# Patient Record
Sex: Female | Born: 1995 | Race: White | Hispanic: No | Marital: Single | Smoking: Never smoker
Health system: Southern US, Community
[De-identification: ages and names within clinical notes are randomized; demographics above are authoritative.]

---

## 2020-05-20 ENCOUNTER — Emergency Department
Admission: EM | Admit: 2020-05-20 | Discharge: 2020-05-20 | Disposition: A | Discharge status: Home / Self Care | Payer: No Typology Code available for payment source | Attending: Emergency Medicine | Admitting: Emergency Medicine

## 2020-05-20 ENCOUNTER — Encounter: Payer: Self-pay | Admitting: Emergency Medicine

## 2020-05-20 ENCOUNTER — Other Ambulatory Visit: Payer: Self-pay

## 2020-05-20 DIAGNOSIS — S3023XA Contusion of vagina and vulva, initial encounter: Secondary | ICD-10-CM | POA: Insufficient documentation

## 2020-05-20 DIAGNOSIS — Y9389 Activity, other specified: Secondary | ICD-10-CM | POA: Diagnosis not present

## 2020-05-20 DIAGNOSIS — Y998 Other external cause status: Secondary | ICD-10-CM | POA: Diagnosis not present

## 2020-05-20 DIAGNOSIS — S3095XA Unspecified superficial injury of vagina and vulva, initial encounter: Secondary | ICD-10-CM | POA: Diagnosis present

## 2020-05-20 DIAGNOSIS — W19XXXA Unspecified fall, initial encounter: Secondary | ICD-10-CM

## 2020-05-20 DIAGNOSIS — W1830XA Fall on same level, unspecified, initial encounter: Secondary | ICD-10-CM | POA: Diagnosis not present

## 2020-05-20 DIAGNOSIS — N9089 Other specified noninflammatory disorders of vulva and perineum: Secondary | ICD-10-CM

## 2020-05-20 DIAGNOSIS — Y92009 Unspecified place in unspecified non-institutional (private) residence as the place of occurrence of the external cause: Secondary | ICD-10-CM | POA: Insufficient documentation

## 2020-05-20 MED ORDER — OXYCODONE-ACETAMINOPHEN 5-325 MG PO TABS: 1.0000 | ORAL_TABLET | Freq: Four times a day (QID) | ORAL | 0 refills | Status: AC | PRN

## 2020-05-20 MED ORDER — OXYCODONE-ACETAMINOPHEN 5-325 MG PO TABS
1.0000 | ORAL_TABLET | Freq: Once | ORAL | Status: AC
  Administered 2020-05-20: 1 via ORAL
  Filled 2020-05-20: qty 1

## 2020-05-20 NOTE — ED Notes (Signed)
See triage note  States she fell back onto her child's wagon   States she sat on the seat  Developed pain and swelling to right labia area

## 2020-05-20 NOTE — ED Provider Notes (Signed)
[facility] Emergency Department Provider Note  ____________________________________________   First MD Initiated Contact with Patient 05/20/20 1255     (approximate)  I have reviewed the triage vital signs and the nursing notes.   HISTORY  Chief Complaint Fall   HPI Suzanne White is a 24 y.o. female presents to the ED with complaint of right labial swelling.  Patient states that she lost her balance this morning and landed on her child's wagon.  Patient states that she hit in between her legs and is now having pain, bruising and swelling to her right labia.  Patient denies any blood seen since this event.  Currently she rates her pain as a 3 out of 10.      History reviewed. No pertinent past medical history.  There are no problems to display for this patient.   Past Surgical History:  Procedure Laterality Date  . CESAREAN SECTION     x 2    Prior to Admission medications   Medication Sig Start Date End Date Taking? Authorizing Provider  oxyCODONE-acetaminophen (PERCOCET) 5-325 MG tablet Take 1 tablet by mouth every 6 (six) hours as needed for severe pain. 05/20/20 05/20/21  Johnn Hai, PA-C    Allergies Patient has no known allergies.  No family history on file.  Social History Social History   Tobacco Use  . Smoking status: Never Smoker  . Smokeless tobacco: Never Used  Substance Use Topics  . Alcohol use: Not Currently  . Drug use: Not Currently    Review of Systems Constitutional: No fever/chills Cardiovascular: Denies chest pain. Respiratory: Denies shortness of breath. Gastrointestinal: No abdominal pain.  No nausea, no vomiting.  Genitourinary: Painful right labia. Musculoskeletal: Negative for back pain. Skin: Negative for rash. Neurological: Negative for headaches, focal weakness or numbness. ____________________________________________   PHYSICAL EXAM:  VITAL SIGNS: ED Triage Vitals  Enc Vitals Group      BP 05/20/20 1116 112/67     Pulse Rate 05/20/20 1116 93     Resp 05/20/20 1116 16     Temp 05/20/20 1116 98.2 F (36.8 C)     Temp Source 05/20/20 1116 Oral     SpO2 05/20/20 1116 100 %     Weight 05/20/20 1117 125 lb (56.7 kg)     Height 05/20/20 1117 5\' 5"  (1.651 m)     Head Circumference --      Peak Flow --      Pain Score 05/20/20 1116 3     Pain Loc --      Pain Edu? --      Excl. in [place]? --     Constitutional: Alert and oriented. Well appearing and in no acute distress. Eyes: Conjunctivae are normal.  Head: Atraumatic. Neck: No stridor.   Cardiovascular: Normal rate, regular rhythm. Grossly normal heart sounds.  Good peripheral circulation. Respiratory: Normal respiratory effort.  No retractions. Lungs CTAB. Gastrointestinal: Soft and nontender. No distention. Genitourinary: On examination of the right labia there is moderate amount of soft tissue edema with ecchymosis present.  Skin is intact and no bleeding is noted in this area or from the vagina.  Area is markedly tender to light touch.  Edema to the distal area is comparable with the size of a small key lime.   Musculoskeletal: Patient is ambulatory without any assistance. Neurologic:  Normal speech and language. No gross focal neurologic deficits are appreciated. No gait instability. Skin:  Skin is warm, dry and intact.  Skin  as noted above. Psychiatric: Mood and affect are normal. Speech and behavior are normal.  ____________________________________________   LABS (all labs ordered are listed, but only abnormal results are displayed)  Labs Reviewed - No data to display  PROCEDURES  Procedure(s) performed (including Critical Care):  Procedures   ____________________________________________   INITIAL IMPRESSION / ASSESSMENT AND PLAN / ED COURSE  As part of my medical decision making, I reviewed the following data within the electronic MEDICAL RECORD NUMBER Notes from prior ED visits and [place] Controlled Substance  Database  24 year old female presents to the ED with a straddle type injury in which she lost her balance and fell on her child's wagon.  She complains of swelling to the right labia but reports no bleeding.  On exam skin is intact and area is edematous with ecchymosis noted to the distal portion of the labia.  No vaginal bleeding is noted.  Patient was given an ice pack while in the ED along with Percocet for pain.  I discussed this with Dr. Erma Heritage who recommended continued ice and pain medication.  At this time there is no imaging that is needed.  Patient was encouraged to return to the emergency department if any worsening of her symptoms or urgent concerns.  She is continue with ice packs to reduce swelling.  Also a prescription for continued pain medication was sent to her pharmacy.  ____________________________________________   FINAL CLINICAL IMPRESSION(S) / ED DIAGNOSES  Final diagnoses:  Hematoma of labia majora  Fall in home, initial encounter     ED Discharge Orders         Ordered    oxyCODONE-acetaminophen (PERCOCET) 5-325 MG tablet  Every 6 hours PRN     Discontinue  Reprint     05/20/20 1438           Note:  This document was prepared using Dragon voice recognition software and may include unintentional dictation errors.    Tommi Rumps, PA-C 05/20/20 1702    Chesley Noon, MD 05/27/20 2035

## 2020-05-20 NOTE — ED Triage Notes (Signed)
Pt to ED via POV, pt states that she stood up his morning and that she fell onto her children's wagon. Pt states that when she fell, she hit her labia on the seat of the wagon, pt reports that her labia is now bruised and swollen. Pt wants to have it looked at to. Pt is in NAD.   Pt reports that she did have a brief syncopal episode. Pt declines to be seen and worked up for this at this time. Pt states that she has had similar episodes in the past when standing too quickly.

## 2020-05-20 NOTE — Discharge Instructions (Signed)
Return to the emergency department over the weekend if any worsening of your symptoms, increase in size, bleeding or urgent concerns.  A prescription for pain medication was sent to your pharmacy.  Do not take this medication if you plan on driving as it could cause drowsiness and increase your risk for injury.  Also avoid anti-inflammatories at this time as they can act as a blood thinner and could cause increase bleeding.  Ice to the area to reduce swelling.

## 2020-05-22 ENCOUNTER — Emergency Department (HOSPITAL_COMMUNITY): Payer: No Typology Code available for payment source

## 2020-05-22 ENCOUNTER — Emergency Department (HOSPITAL_COMMUNITY)
Admission: EM | Admit: 2020-05-22 | Discharge: 2020-05-22 | Disposition: A | Discharge status: Home / Self Care | Payer: No Typology Code available for payment source | Attending: Emergency Medicine | Admitting: Emergency Medicine

## 2020-05-22 ENCOUNTER — Encounter (HOSPITAL_COMMUNITY): Payer: Self-pay

## 2020-05-22 ENCOUNTER — Other Ambulatory Visit: Payer: Self-pay

## 2020-05-22 DIAGNOSIS — W01198D Fall on same level from slipping, tripping and stumbling with subsequent striking against other object, subsequent encounter: Secondary | ICD-10-CM | POA: Diagnosis not present

## 2020-05-22 DIAGNOSIS — N9089 Other specified noninflammatory disorders of vulva and perineum: Secondary | ICD-10-CM

## 2020-05-22 DIAGNOSIS — R102 Pelvic and perineal pain: Secondary | ICD-10-CM | POA: Diagnosis not present

## 2020-05-22 DIAGNOSIS — S3023XD Contusion of vagina and vulva, subsequent encounter: Secondary | ICD-10-CM | POA: Insufficient documentation

## 2020-05-22 LAB — CBC WITH DIFFERENTIAL/PLATELET
Abs Immature Granulocytes: 0.02 10*3/uL (ref 0.00–0.07)
Basophils Absolute: 0 10*3/uL (ref 0.0–0.1)
Basophils Relative: 0 %
Eosinophils Absolute: 0 10*3/uL (ref 0.0–0.5)
Eosinophils Relative: 0 %
HCT: 37.4 % (ref 36.0–46.0)
Hemoglobin: 12.2 g/dL (ref 12.0–15.0)
Immature Granulocytes: 0 %
Lymphocytes Relative: 9 %
Lymphs Abs: 0.9 10*3/uL (ref 0.7–4.0)
MCH: 28.8 pg (ref 26.0–34.0)
MCHC: 32.6 g/dL (ref 30.0–36.0)
MCV: 88.2 fL (ref 80.0–100.0)
Monocytes Absolute: 0.6 10*3/uL (ref 0.1–1.0)
Monocytes Relative: 6 %
Neutro Abs: 8.6 10*3/uL — ABNORMAL HIGH (ref 1.7–7.7)
Neutrophils Relative %: 85 %
Platelets: 323 10*3/uL (ref 150–400)
RBC: 4.24 MIL/uL (ref 3.87–5.11)
RDW: 13.1 % (ref 11.5–15.5)
WBC: 10.1 10*3/uL (ref 4.0–10.5)
nRBC: 0 % (ref 0.0–0.2)

## 2020-05-22 LAB — BASIC METABOLIC PANEL
Anion gap: 11 (ref 5–15)
BUN: 12 mg/dL (ref 6–20)
CO2: 20 mmol/L — ABNORMAL LOW (ref 22–32)
Calcium: 9.1 mg/dL (ref 8.9–10.3)
Chloride: 108 mmol/L (ref 98–111)
Creatinine, Ser: 0.78 mg/dL (ref 0.44–1.00)
GFR calc Af Amer: >60 mL/min (ref >60)
GFR calc non Af Amer: >60 mL/min (ref >60)
Glucose, Bld: 70 mg/dL (ref 70–99)
Potassium: 3.6 mmol/L (ref 3.5–5.1)
Sodium: 139 mmol/L (ref 135–145)

## 2020-05-22 LAB — I-STAT BETA HCG BLOOD, ED (MC, WL, AP ONLY): I-stat hCG, quantitative: <5 m[IU]/mL (ref <5)

## 2020-05-22 MED ORDER — IOHEXOL 300 MG/ML  SOLN
100.0000 mL | Freq: Once | INTRAMUSCULAR | Status: AC | PRN
  Administered 2020-05-22: 100 mL via INTRAVENOUS

## 2020-05-22 MED ORDER — OXYCODONE-ACETAMINOPHEN 5-325 MG PO TABS
1.0000 | ORAL_TABLET | Freq: Once | ORAL | Status: AC
  Administered 2020-05-22: 1 via ORAL
  Filled 2020-05-22: qty 1

## 2020-05-22 NOTE — ED Triage Notes (Signed)
Pt presents with vaginal "hematoma" x3 days, pt seen at [facility] in [place] for the same on Friday, pt prescribed oxycodone but unable to get the RX filled. Pt reports increase pain today after showering. Pt states she fell on her childs toy

## 2020-05-22 NOTE — Discharge Instructions (Addendum)
You have been treated for a hematoma.  You were prescribed pain meds at [facility] please take as prescribed.  Do not take that medication with Tylenol or operate heavy machinery as this medication can make you drowsy.  I want you to apply ice to the area as this will help with inflammation and swelling.  Please do not place ice on bare skin as this can cause a freeze burn.  Please rest the area and allow proper time for healing.  I want you to follow-up at an OB/GYN.I have given you the one in [place] but you may follow-up at another that is closer to home.  Please follow-up in 1 week's time  Please come back to emergency department if you develop difficulty urinating, see blood in your urine, have increased swelling, pain, shortness of breath, chest pain, dizziness, leg pain or swelling as these symptoms require further evaluation.

## 2020-05-22 NOTE — ED Provider Notes (Signed)
Patient was received at shift change from [facility] PA-C please look at her note for HPI. Physical Exam  BP 107/68 (BP Location: Left Arm)   Pulse 88   Temp 98.7 F (37.1 C) (Oral)   Resp 16   Ht 5\' 5"  (1.651 m)   Wt 58.1 kg   SpO2 100%   BMI 21.30 kg/m   Physical Exam Vitals and nursing note reviewed.  Constitutional:      General: She is not in acute distress.    Appearance: Normal appearance. She is not ill-appearing or diaphoretic.  HENT:     Head: Normocephalic and atraumatic.     Nose: No congestion.  Eyes:     General: No scleral icterus.       Right eye: No discharge.        Left eye: No discharge.     Conjunctiva/sclera: Conjunctivae normal.  Pulmonary:     Effort: Pulmonary effort is normal. No respiratory distress.     Breath sounds: Normal breath sounds. No wheezing.  Abdominal:     General: There is no distension.     Palpations: Abdomen is soft.     Tenderness: There is no abdominal tenderness. There is no guarding.  Musculoskeletal:     Cervical back: Neck supple.     Right lower leg: No edema.     Left lower leg: No edema.  Skin:    General: Skin is warm and dry.     Coloration: Skin is not jaundiced or pale.  Neurological:     Mental Status: She is alert and oriented to person, place, and time.  Psychiatric:        Mood and Affect: Mood normal.     ED Course/Procedures     Procedures  MDM  I have personally reviewed all imaging, labs and have interpreted them.  Patient was worked up for internal trauma, pending CT pelvis, BMP and CBC.  Patient's hCG came back less than 5, BMP did not show any electrolyte abnormalities no signs of AKI, CBC did not show leukocytosis or signs of anemia.  CT abdomen/pelvis showed high density soft tissue lesion in the labia which represents a soft tissue hematoma or mass with edema in the adjacent area.  No other acute abnormalities noted.  Unlikely the patient suffering from a internal bleed and is hemodynamic  stable.  Vital signs have remained stable. consulted with OB/GYN asking for recommendations and further management of  patient. Spoke with Dr. OB/GYN who looked at patient's chart and is comfortable seeing her as an outpatient and recommend ice packs and following up at OB/GYN in 1 week for reevaluation.   Patient appears to be resting comfortably in bed, does not show  signs of acute distress.  Patient vitals have remained stable and does not meet criteria to be admitted to the hospital.  Likely patient has a hematoma as result of falling on a toy car.  Recommend that patient follows up with OB,  given at home care as well as strict return precautions. Patient has a prescription for narcotics which she was  Prescribed when she was at [facility] for same complaint.  Patient was discussed with attending who agrees with assessment and plan.  Patient was explained the results and and plan, patient verbalized that she understood and agrees with said plan.       [facility], PA-C 05/22/20 1851    05/24/20, MD 05/26/20 1116

## 2020-05-22 NOTE — ED Notes (Signed)
Going to c-t soon  

## 2020-05-22 NOTE — ED Provider Notes (Signed)
[facility] EMERGENCY DEPARTMENT Provider Note   CSN: [id number] Arrival date & time: 05/22/20  1227     History Chief Complaint  Patient presents with  . Vaginal Pain    Suzanne White is a 24 y.o. female.  24 y.o female with no PMH presents to the ED with a chief complaint of vaginal pain x 2 days ago. She reports falling on his 24 year old's wagon, on Friday night. She was evaluated at [facility] ED.  She reports she was given pain medication which she has not filled, states the hematoma has now worsened.  States it was improving yesterday, however now appears to be darker, larger in size.  Patient did take a warm shower prior to arrival in the ED and states this worsened the size.  He has taken ibuprofen for pain without any improvement in symptoms.  She is currently wearing a pad, states that she is likely to get her menstrual cycle this week but is unsure whether there is been bleeding from the hematoma site.  She is has no urinary symptoms at this time, no fevers, no other injuries.   The history is provided by the patient.  Vaginal Pain Pertinent negatives include no chest pain, no abdominal pain, no headaches and no shortness of breath.       History reviewed. No pertinent past medical history.  There are no problems to display for this patient.   Past Surgical History:  Procedure Laterality Date  . CESAREAN SECTION     x 2     OB History   No obstetric history on file.     History reviewed. No pertinent family history.  Social History   Tobacco Use  . Smoking status: Never Smoker  . Smokeless tobacco: Never Used  Substance Use Topics  . Alcohol use: Not Currently  . Drug use: Not Currently    Home Medications Prior to Admission medications   Medication Sig Start Date End Date Taking? Authorizing Provider  oxyCODONE-acetaminophen (PERCOCET) 5-325 MG tablet Take 1 tablet by mouth every 6 (six) hours as needed for severe pain. 05/20/20  05/20/21  Tommi Rumps, PA-C    Allergies    Patient has no known allergies.  Review of Systems   Review of Systems  Constitutional: Negative for fever.  HENT: Negative for sore throat.   Respiratory: Negative for shortness of breath.   Cardiovascular: Negative for chest pain.  Gastrointestinal: Negative for abdominal pain, nausea and vomiting.  Genitourinary: Positive for pelvic pain, vaginal bleeding and vaginal pain. Negative for difficulty urinating, flank pain and hematuria.  Musculoskeletal: Negative for back pain.  Skin: Negative for pallor and wound.  Neurological: Negative for light-headedness and headaches.  All other systems reviewed and are negative.   Physical Exam Updated Vital Signs BP 107/68 (BP Location: Left Arm)   Pulse 88   Temp 98.7 F (37.1 C) (Oral)   Resp 16   Ht 5\' 5"  (1.651 m)   Wt 58.1 kg   SpO2 100%   BMI 21.30 kg/m   Physical Exam Vitals and nursing note reviewed. Exam conducted with a chaperone present.  Constitutional:      Appearance: Normal appearance.  HENT:     Head: Normocephalic and atraumatic.     Nose: Nose normal.  Eyes:     Pupils: Pupils are equal, round, and reactive to light.  Cardiovascular:     Rate and Rhythm: Normal rate.  Pulmonary:     Effort: Pulmonary  effort is normal.     Breath sounds: No wheezing or rales.  Abdominal:     General: Abdomen is flat.     Tenderness: There is no abdominal tenderness. There is no right CVA tenderness.  Genitourinary:    Exam position: Supine.     Labia:        Right: Injury present.        Left: Injury present.      Urethra: No urethral pain.     Vagina: Signs of injury present. Erythema and tenderness present. No vaginal discharge or bleeding.     Comments: Right labia swelling with hematoma present around 4-5 cm extending into the vulva. Minimal blood noted, no site opening.  Please see photos attached.  Musculoskeletal:     Cervical back: Normal range of motion and  neck supple.  Skin:    General: Skin is warm and dry.  Neurological:     Mental Status: She is alert and oriented to person, place, and time.         ED Results / Procedures / Treatments   Labs (all labs ordered are listed, but only abnormal results are displayed) Labs Reviewed  CBC WITH DIFFERENTIAL/PLATELET  BASIC METABOLIC PANEL  HCG, SERUM, QUALITATIVE    EKG None  Radiology No results found.  Procedures Procedures (including critical care time)  Medications Ordered in ED Medications  oxyCODONE-acetaminophen (PERCOCET/ROXICET) 5-325 MG per tablet 1 tablet (1 tablet Oral Given 05/22/20 1442)    ED Course  I have reviewed the triage vital signs and the nursing notes.  Pertinent labs & imaging results that were available during my care of the patient were reviewed by me and considered in my medical decision making (see chart for details).    MDM Rules/Calculators/A&P    Patient presents here via EMS with a chief complaint of right vaginal pain, reports she fell on her children's toy about 2 days ago, evaluated at [facility] ED.  She was discharged home with Percocet along with commendations for ice therapy.  She reports hematoma somewhat improved yesterday, however today after a hot shower this increase in size, began to turn colors.  She does have extensive pain along the right labia, she was evaluated with chaperone nurse tech at the bedside along with Dr. Billy Fischer, bruising appears to be 4 to 5 cm along the right labia extending into the vulva.  She reports no problems urinating, no fevers, no secondary trauma.  She is currently not on any blood thinners.  States no hematuria, although she is post to begin her menstrual cycle this week and is currently wearing a pad. LMP 1 month ago.   02:30 PM Spoke to GYN on call who recommended hemoglobin check, along with CT pelvis to further evaluate injury.  Patient's pictures were added to her chart with her permission,  chaperone was present at the bedside during encounter.  Patient is aware she will need labs, CT.  She is to remain n.p.o. until CT results return.  She was given oral medication such as Percocet for pain control.  Patient care signed out to  [facility] at shift change pending disposition.  Portions of this note were generated with Lobbyist. Dictation errors may occur despite best attempts at proofreading.  Final Clinical Impression(s) / ED Diagnoses Final diagnoses:  Vaginal pain  Vaginal hematoma    Rx / DC Orders ED Discharge Orders    None       Janeece Fitting, Hershal Coria 05/22/20  1504    Alvira Monday, MD 05/22/20 2150

## 2020-05-27 NOTE — H&P (Signed)
surgery posted 05/30/20 instead of 06/01/20

## 2020-05-27 NOTE — H&P (Signed)
Suzanne White is a 24 y.o. female here for Hematoma on labia- evacuation .Consult from [facility] / PA  [place]  Pt is here for a follow up for labial trauma/ hematoma formation after hitting labia on her childs wagon  . Seen in the ED 5/18  ctcscan showed a 5 cm collection of fluid in labia   still extremely painful . She is taking oxycodone   Past Medical History:  has no past medical history on file.  Past Surgical History:  has a past surgical history that includes Cesarean section (2019) and Cesarean section (2020). Family History: family history includes No Known Problems in her father and mother. Social History:  reports that she has never smoked. She has never used smokeless tobacco. She reports previous alcohol use. She reports that she does not use drugs. OB/GYN History:  OB History    Gravida  2   Para  2   Term  2   Preterm      AB      Living  2     SAB      TAB      Ectopic      Molar      Multiple      Live Births  2          Allergies: has No Known Allergies. Medications:  Current Outpatient Medications:  .  oxyCODONE-acetaminophen (PERCOCET) 5-325 mg tablet, Take by mouth, Disp: , Rfl:  .  ibuprofen (MOTRIN) 800 MG tablet, Take 1 tablet (800 mg total) by mouth every 8 (eight) hours as needed for Pain, Disp: 30 tablet, Rfl: 1 .  ondansetron (ZOFRAN) 8 MG tablet, Take 1 tablet (8 mg total) by mouth every 8 (eight) hours as needed for Nausea, Disp: 30 tablet, Rfl: 0  Review of Systems: General:   No fatigue or weight loss Eyes:   No vision changes Ears:   No hearing difficulty Respiratory:                No cough or shortness of breath Pulmonary:   No asthma or shortness of breath Cardiovascular:        No chest pain, palpitations, dyspnea on exertion Gastrointestinal:          No abdominal bloating, chronic diarrhea, constipations, masses, pain or hematochezia Genitourinary:  No hematuria, dysuria, abnormal vaginal discharge, pelvic pain,  Menometrorrhagia Lymphatic:  No swollen lymph nodes Musculoskeletal: No muscle weakness Neurologic:  No extremity weakness, syncope, seizure disorder Psychiatric:  No history of depression, delusions or suicidal/homicidal ideation    Exam:   Vitals:   05/27/20 1335  BP: 102/60    Body mass index is 17.91 kg/m.  WDWN white/  female in NAD   Lungs: CTA  CV : RRR without murmur   Abdomen: soft , no mass, normal active bowel sounds,  non-tender, no rebound tenderness Pelvic: tanner stage 5 ,  External genitalia: vulva /labia no lesions 6x 5 cm violaceous hematoma with central protrusion  Partial occluding vagina. ++ TTP .  Urethra: no prolapse Vagina: single digt with 5 cm tracking into vagina .  Cervix:   Impression:   The encounter diagnosis was Vulvar hematoma.  Extremely symptomatic ,distorting anatomy . Difficult to wear underwear or sit   Plan:   Options of expectant management vs surgical evacuation offered . Pro and cons  Explained  . She elects for surgical evacuation on 06/01/20 Benefits and risks to surgery: The proposed benefit of the surgery has been  discussed with the patient. The possible risks include, but are not limited to: organ injury to the bowel , bladder, ureters, and major blood vessels and nerves. There is a possibility of additional surgeries resulting from these injuries. There is also the risk of blood transfusion and the need to receive blood products during or after the procedure which may rarely lead to HIV or Hepatitis C infection. There is a risk of developing a deep venous thrombosis or a pulmonary embolism . There is the possibility of wound infection and also anesthetic complications, even the rare possibility of death. The patient understands these risks and wishes to proceed. All questions have been answered and the consent has been signed.       Caroline Sauger, MD

## 2020-05-30 ENCOUNTER — Ambulatory Visit: Payer: No Typology Code available for payment source | Admitting: Anesthesiology

## 2020-05-30 ENCOUNTER — Other Ambulatory Visit: Payer: Self-pay

## 2020-05-30 ENCOUNTER — Other Ambulatory Visit
Admission: RE | Admit: 2020-05-30 | Discharge: 2020-05-30 | Disposition: A | Discharge status: Home / Self Care | Payer: No Typology Code available for payment source | Source: Ambulatory Visit | Attending: Obstetrics and Gynecology | Admitting: Obstetrics and Gynecology

## 2020-05-30 ENCOUNTER — Ambulatory Visit
Admission: RE | Admit: 2020-05-30 | Discharge: 2020-05-30 | Disposition: A | Discharge status: Home / Self Care | Payer: No Typology Code available for payment source | Attending: Obstetrics and Gynecology | Admitting: Obstetrics and Gynecology

## 2020-05-30 ENCOUNTER — Encounter: Payer: Self-pay | Admitting: Obstetrics and Gynecology

## 2020-05-30 ENCOUNTER — Encounter
Admission: RE | Disposition: A | Discharge status: Home / Self Care | Payer: Self-pay | Source: Home / Self Care | Attending: Obstetrics and Gynecology

## 2020-05-30 DIAGNOSIS — S3023XA Contusion of vagina and vulva, initial encounter: Secondary | ICD-10-CM | POA: Diagnosis not present

## 2020-05-30 DIAGNOSIS — Z79899 Other long term (current) drug therapy: Secondary | ICD-10-CM | POA: Diagnosis not present

## 2020-05-30 DIAGNOSIS — Z20822 Contact with and (suspected) exposure to covid-19: Secondary | ICD-10-CM | POA: Diagnosis not present

## 2020-05-30 DIAGNOSIS — W228XXA Striking against or struck by other objects, initial encounter: Secondary | ICD-10-CM | POA: Diagnosis not present

## 2020-05-30 HISTORY — PX: INCISION AND DRAINAGE ABSCESS: SHX5864

## 2020-05-30 LAB — BASIC METABOLIC PANEL
Anion gap: 8 (ref 5–15)
BUN: 12 mg/dL (ref 6–20)
CO2: 26 mmol/L (ref 22–32)
Calcium: 8.6 mg/dL — ABNORMAL LOW (ref 8.9–10.3)
Chloride: 104 mmol/L (ref 98–111)
Creatinine, Ser: 0.73 mg/dL (ref 0.44–1.00)
GFR calc Af Amer: >60 mL/min (ref >60)
GFR calc non Af Amer: >60 mL/min (ref >60)
Glucose, Bld: 85 mg/dL (ref 70–99)
Potassium: 3.7 mmol/L (ref 3.5–5.1)
Sodium: 138 mmol/L (ref 135–145)

## 2020-05-30 LAB — SARS CORONAVIRUS 2 BY RT PCR (HOSPITAL ORDER, PERFORMED IN ~~LOC~~ HOSPITAL LAB): SARS Coronavirus 2: NEGATIVE

## 2020-05-30 LAB — CBC
HCT: 34.5 % — ABNORMAL LOW (ref 36.0–46.0)
Hemoglobin: 11.7 g/dL — ABNORMAL LOW (ref 12.0–15.0)
MCH: 29.1 pg (ref 26.0–34.0)
MCHC: 33.9 g/dL (ref 30.0–36.0)
MCV: 85.8 fL (ref 80.0–100.0)
Platelets: 395 10*3/uL (ref 150–400)
RBC: 4.02 MIL/uL (ref 3.87–5.11)
RDW: 13.3 % (ref 11.5–15.5)
WBC: 5.3 10*3/uL (ref 4.0–10.5)
nRBC: 0 % (ref 0.0–0.2)

## 2020-05-30 LAB — TYPE AND SCREEN
ABO/RH(D): O POS
Antibody Screen: NEGATIVE

## 2020-05-30 LAB — POCT PREGNANCY, URINE: Preg Test, Ur: NEGATIVE

## 2020-05-30 SURGERY — INCISION AND DRAINAGE, ABSCESS
Anesthesia: General

## 2020-05-30 MED ORDER — LACTATED RINGERS IV SOLN: INTRAVENOUS | Status: DC

## 2020-05-30 MED ORDER — MIDAZOLAM HCL 2 MG/2ML IJ SOLN
INTRAMUSCULAR | Status: AC
  Filled 2020-05-30: qty 2

## 2020-05-30 MED ORDER — KETOROLAC TROMETHAMINE 30 MG/ML IJ SOLN
INTRAMUSCULAR | Status: DC | PRN
  Administered 2020-05-30: 30 mg via INTRAVENOUS

## 2020-05-30 MED ORDER — LIDOCAINE-EPINEPHRINE 1 %-1:100000 IJ SOLN
INTRAMUSCULAR | Status: AC
  Filled 2020-05-30: qty 1

## 2020-05-30 MED ORDER — LIDOCAINE HCL (CARDIAC) PF 100 MG/5ML IV SOSY
PREFILLED_SYRINGE | INTRAVENOUS | Status: DC | PRN
  Administered 2020-05-30: 60 mg via INTRAVENOUS

## 2020-05-30 MED ORDER — LIDOCAINE HCL (PF) 2 % IJ SOLN
INTRAMUSCULAR | Status: AC
  Filled 2020-05-30: qty 5

## 2020-05-30 MED ORDER — CHLORHEXIDINE GLUCONATE 0.12 % MT SOLN: 15.0000 mL | Freq: Once | OROMUCOSAL | Status: AC

## 2020-05-30 MED ORDER — ONDANSETRON HCL 4 MG/2ML IJ SOLN
4.0000 mg | Freq: Once | INTRAMUSCULAR | Status: AC | PRN
  Administered 2020-05-30: 4 mg via INTRAVENOUS

## 2020-05-30 MED ORDER — ACETAMINOPHEN 500 MG PO TABS: 1000.0000 mg | ORAL_TABLET | ORAL | Status: AC

## 2020-05-30 MED ORDER — OXYCODONE HCL 5 MG/5ML PO SOLN: 5.0000 mg | Freq: Once | ORAL | Status: DC | PRN

## 2020-05-30 MED ORDER — CEFAZOLIN SODIUM-DEXTROSE 2-4 GM/100ML-% IV SOLN
2.0000 g | Freq: Once | INTRAVENOUS | Status: AC
  Administered 2020-05-30: 2 g via INTRAVENOUS

## 2020-05-30 MED ORDER — OXYCODONE HCL 5 MG PO TABS: 5.0000 mg | ORAL_TABLET | Freq: Once | ORAL | Status: DC | PRN

## 2020-05-30 MED ORDER — ORAL CARE MOUTH RINSE: 15.0000 mL | Freq: Once | OROMUCOSAL | Status: AC

## 2020-05-30 MED ORDER — FENTANYL CITRATE (PF) 100 MCG/2ML IJ SOLN: 25.0000 ug | INTRAMUSCULAR | Status: DC | PRN

## 2020-05-30 MED ORDER — PROPOFOL 10 MG/ML IV BOLUS
INTRAVENOUS | Status: AC
  Filled 2020-05-30: qty 20

## 2020-05-30 MED ORDER — CHLORHEXIDINE GLUCONATE 0.12 % MT SOLN
OROMUCOSAL | Status: AC
  Administered 2020-05-30: 15 mL via OROMUCOSAL
  Filled 2020-05-30: qty 15

## 2020-05-30 MED ORDER — FENTANYL CITRATE (PF) 100 MCG/2ML IJ SOLN
INTRAMUSCULAR | Status: AC
  Filled 2020-05-30: qty 2

## 2020-05-30 MED ORDER — GABAPENTIN 300 MG PO CAPS: 300.0000 mg | ORAL_CAPSULE | ORAL | Status: AC

## 2020-05-30 MED ORDER — PHENYLEPHRINE HCL (PRESSORS) 10 MG/ML IV SOLN
INTRAVENOUS | Status: DC | PRN
  Administered 2020-05-30 (×2): 100 ug via INTRAVENOUS

## 2020-05-30 MED ORDER — KETOROLAC TROMETHAMINE 30 MG/ML IJ SOLN
INTRAMUSCULAR | Status: AC
  Filled 2020-05-30: qty 1

## 2020-05-30 MED ORDER — CEFAZOLIN SODIUM-DEXTROSE 2-4 GM/100ML-% IV SOLN
INTRAVENOUS | Status: AC
  Filled 2020-05-30: qty 100

## 2020-05-30 MED ORDER — POVIDONE-IODINE 10 % EX SWAB: 2.0000 "application " | Freq: Once | CUTANEOUS | Status: DC

## 2020-05-30 MED ORDER — GABAPENTIN 300 MG PO CAPS
ORAL_CAPSULE | ORAL | Status: AC
  Administered 2020-05-30: 300 mg via ORAL
  Filled 2020-05-30: qty 1

## 2020-05-30 MED ORDER — ACETAMINOPHEN 500 MG PO TABS
ORAL_TABLET | ORAL | Status: AC
  Administered 2020-05-30: 1000 mg via ORAL
  Filled 2020-05-30: qty 2

## 2020-05-30 MED ORDER — MIDAZOLAM HCL 2 MG/2ML IJ SOLN
INTRAMUSCULAR | Status: DC | PRN
  Administered 2020-05-30: 2 mg via INTRAVENOUS

## 2020-05-30 MED ORDER — DEXAMETHASONE SODIUM PHOSPHATE 10 MG/ML IJ SOLN
INTRAMUSCULAR | Status: DC | PRN
  Administered 2020-05-30: 8 mg via INTRAVENOUS

## 2020-05-30 MED ORDER — PROPOFOL 10 MG/ML IV BOLUS
INTRAVENOUS | Status: DC | PRN
  Administered 2020-05-30: 130 mg via INTRAVENOUS

## 2020-05-30 MED ORDER — FENTANYL CITRATE (PF) 100 MCG/2ML IJ SOLN
INTRAMUSCULAR | Status: DC | PRN
  Administered 2020-05-30: 25 ug via INTRAVENOUS
  Administered 2020-05-30: 50 ug via INTRAVENOUS
  Administered 2020-05-30: 25 ug via INTRAVENOUS

## 2020-05-30 SURGICAL SUPPLY — 25 items
BLADE SURG 15 STRL LF DISP TIS (BLADE) ×1 IMPLANT
BLADE SURG 15 STRL SS (BLADE) ×1
BLADE SURG SZ10 CARB STEEL (BLADE) ×2 IMPLANT
CATH ROBINSON RED A/P 16FR (CATHETERS) ×2 IMPLANT
COVER WAND RF STERILE (DRAPES) ×2 IMPLANT
DRAPE PERI LITHO V/GYN (MISCELLANEOUS) ×2 IMPLANT
DRAPE UNDER BUTTOCK W/FLU (DRAPES) ×2 IMPLANT
ELECT REM PT RETURN 9FT ADLT (ELECTROSURGICAL) ×2
ELECTRODE REM PT RTRN 9FT ADLT (ELECTROSURGICAL) ×1 IMPLANT
GAUZE 4X4 16PLY RFD (DISPOSABLE) ×2 IMPLANT
GLOVE SURG SYN 8.0 (GLOVE) ×2 IMPLANT
GOWN STRL REUS W/ TWL LRG LVL3 (GOWN DISPOSABLE) ×1 IMPLANT
GOWN STRL REUS W/ TWL XL LVL3 (GOWN DISPOSABLE) ×1 IMPLANT
GOWN STRL REUS W/TWL LRG LVL3 (GOWN DISPOSABLE) ×1
GOWN STRL REUS W/TWL XL LVL3 (GOWN DISPOSABLE) ×1
KIT TURNOVER KIT A (KITS) ×2 IMPLANT
LABEL OR SOLS (LABEL) ×2 IMPLANT
NEEDLE HYPO 22GX1.5 SAFETY (NEEDLE) ×2 IMPLANT
NS IRRIG 500ML POUR BTL (IV SOLUTION) ×2 IMPLANT
PACK BASIN MINOR (MISCELLANEOUS) ×2 IMPLANT
PAD OB MATERNITY 4.3X12.25 (PERSONAL CARE ITEMS) ×2 IMPLANT
PAD PREP 24X41 OB/GYN DISP (PERSONAL CARE ITEMS) ×2 IMPLANT
SUT VIC AB 4-0 FS2 27 (SUTURE) ×2 IMPLANT
SUT VICRYL AB 3-0 FS1 BRD 27IN (SUTURE) ×2 IMPLANT
SYR CONTROL 10ML LL (SYRINGE) ×2 IMPLANT

## 2020-05-30 NOTE — Progress Notes (Signed)
Pt is ready for vulvar hematoma evacuation . LAbs reviewed . NPO . Proceed . She has  Post op meds at home

## 2020-05-30 NOTE — Transfer of Care (Signed)
Immediate Anesthesia Transfer of Care Note  Patient: Suzanne [name]  Procedure(s) Performed: EVACUATION OF LABIAL HEMATOMA (N/A )  Patient Location: PACU  Anesthesia Type:General  Level of Consciousness: awake, alert  and oriented  Airway & Oxygen Therapy: Patient Spontanous Breathing and Patient connected to face mask oxygen  Post-op Assessment: Report given to RN and Post -op Vital signs reviewed and stable  Post vital signs: Reviewed and stable  Last Vitals:  Vitals Value Taken Time  BP 110/77 05/30/20 1525  Temp 37.1 C 05/30/20 1525  Pulse 87 05/30/20 1530  Resp 13 05/30/20 1530  SpO2 100 % 05/30/20 1530  Vitals shown include unvalidated device data.  Last Pain:  Vitals:   05/30/20 1525  TempSrc:   PainSc: 0-No pain      Patients Stated Pain Goal: 0 (05/30/20 1058)  Complications: No complications documented.

## 2020-05-30 NOTE — Anesthesia Procedure Notes (Signed)
Procedure Name: LMA Insertion Date/Time: 05/30/2020 2:33 PM Performed by: Omer Jack, CRNA Pre-anesthesia Checklist: Patient identified, Patient being monitored, Timeout performed, Emergency Drugs available and Suction available Patient Re-evaluated:Patient Re-evaluated prior to induction Oxygen Delivery Method: Circle system utilized Preoxygenation: Pre-oxygenation with 100% oxygen Induction Type: IV induction Ventilation: Mask ventilation without difficulty LMA: LMA inserted LMA Size: 3.5 Tube type: Oral Number of attempts: 1 Placement Confirmation: positive ETCO2 and breath sounds checked- equal and bilateral Tube secured with: Tape Dental Injury: Teeth and Oropharynx as per pre-operative assessment

## 2020-05-30 NOTE — Anesthesia Preprocedure Evaluation (Signed)
Anesthesia Evaluation  Patient identified by MRN, date of birth, ID band Patient awake    Reviewed: Allergy & Precautions, NPO status , Patient's Chart, lab work & pertinent test results  History of Anesthesia Complications Negative for: history of anesthetic complications  Airway Mallampati: II  TM Distance: >3 FB Neck ROM: Full    Dental no notable dental hx. (+) Teeth Intact, Dental Advisory Given   Pulmonary neg pulmonary ROS, neg sleep apnea, neg COPD, Patient abstained from smoking.Not current smoker,    Pulmonary exam normal breath sounds clear to auscultation       Cardiovascular Exercise Tolerance: Good METS(-) hypertension(-) CAD and (-) Past MI negative cardio ROS  (-) dysrhythmias  Rhythm:Regular Rate:Normal - Systolic murmurs    Neuro/Psych negative neurological ROS  negative psych ROS   GI/Hepatic neg GERD  ,(+)     (-) substance abuse  ,   Endo/Other  neg diabetes  Renal/GU negative Renal ROS     Musculoskeletal   Abdominal   Peds  Hematology   Anesthesia Other Findings History reviewed. No pertinent past medical history.  Reproductive/Obstetrics                             Anesthesia Physical Anesthesia Plan  ASA: I  Anesthesia Plan: General   Post-op Pain Management:    Induction: Intravenous  PONV Risk Score and Plan: 4 or greater and Ondansetron, Dexamethasone and Midazolam  Airway Management Planned: LMA  Additional Equipment: None  Intra-op Plan:   Post-operative Plan: Extubation in OR  Informed Consent: I have reviewed the patients History and Physical, chart, labs and discussed the procedure including the risks, benefits and alternatives for the proposed anesthesia with the patient or authorized representative who has indicated his/her understanding and acceptance.     Dental advisory given  Plan Discussed with: CRNA and Surgeon  Anesthesia  Plan Comments: (Discussed risks of anesthesia with patient, including PONV, sore throat, lip/dental damage. Rare risks discussed as well, such as cardiorespiratory and neurological sequelae. Patient understands.)        Anesthesia Quick Evaluation

## 2020-05-30 NOTE — Brief Op Note (Signed)
05/30/2020  3:18 PM  PATIENT:  Suzanne [name]  23 y.o. female  PRE-OPERATIVE DIAGNOSIS:  LABIAL HEMATOMA  POST-OPERATIVE DIAGNOSIS:  LABIAL HEMATOMA  PROCEDURE:  Procedure(s): EVACUATION OF LABIAL HEMATOMA (N/A)  SURGEON:  Surgeon(s) and Role:    * Jaqlyn Gruenhagen, Ihor Austin, MD - Primary    * Christeen Douglas, MD - Assisting  PHYSICIAN ASSISTANT:   ASSISTANTS: none   ANESTHESIA: gets EBL: minimal   IOF 800 cc UO 400 cc  BLOOD ADMINISTERED:none  DRAINS: none   LOCAL MEDICATIONS USED:  NONE  SPECIMEN:  No Specimen  DISPOSITION OF SPECIMEN:  N/A  COUNTS:  YES  TOURNIQUET:  * No tourniquets in log *  DICTATION: .Other Dictation: Dictation Number verbal  PLAN OF CARE: Discharge to home after PACU  PATIENT DISPOSITION:  PACU - hemodynamically stable.   Delay start of Pharmacological VTE agent (>24hrs) due to surgical blood loss or risk of bleeding: not applicable

## 2020-05-30 NOTE — Discharge Instructions (Addendum)
You received tylenol 1000mg  at the hospital at 1105 am; and gabapentin 300mg  at 1106 am.    AMBULATORY SURGERY  DISCHARGE INSTRUCTIONS   1) The drugs that you were given will stay in your system until tomorrow so for the next 24 hours you should not:  A) Drive an automobile B) Make any legal decisions C) Drink any alcoholic beverage   2) You may resume regular meals tomorrow.  Today it is better to start with liquids and gradually work up to solid foods.  You may eat anything you prefer, but it is better to start with liquids, then soup and crackers, and gradually work up to solid foods.   3) Please notify your doctor immediately if you have any unusual bleeding, trouble breathing, redness and pain at the surgery site, drainage, fever, or pain not relieved by medication.    4) Additional Instructions:        Please contact your physician with any problems or Same Day Surgery at [phone], Monday through Friday 6 am to 4 pm, or [place] at [facility] number at [phone].
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
## 2020-05-31 ENCOUNTER — Encounter: Payer: Self-pay | Admitting: Obstetrics and Gynecology

## 2020-05-31 NOTE — Addendum Note (Signed)
Addendum  created 05/31/20 1018 by Lenard Simmer, MD   Clinical Note Signed

## 2020-05-31 NOTE — Anesthesia Postprocedure Evaluation (Addendum)
Anesthesia Post Note  Patient: IT sales professional  Procedure(s) Performed: EVACUATION OF LABIAL HEMATOMA (N/A )  Patient location during evaluation: PACU Anesthesia Type: General Level of consciousness: awake and alert Pain management: pain level controlled Vital Signs Assessment: post-procedure vital signs reviewed and stable Respiratory status: spontaneous breathing, nonlabored ventilation, respiratory function stable and patient connected to nasal cannula oxygen Cardiovascular status: blood pressure returned to baseline and stable Postop Assessment: no apparent nausea or vomiting Anesthetic complications: no   No complications documented.   Last Vitals:  Vitals:   05/30/20 1624 05/30/20 1639  BP: 114/85 112/81  Pulse: 69 67  Resp: 14 16  Temp: 36.8 C   SpO2: 100% 100%    Last Pain:  Vitals:   05/31/20 0842  TempSrc:   PainSc: 0-No pain                 Lenard Simmer

## 2021-07-28 IMAGING — CT CT PELVIS W/ CM
2 of 4 series · 16 of 46 positions shown, 18 images · IV contrast (omnipaque)
Comparison: None.

CLINICAL DATA: Pelvic pain. The patient fell and had a vaginal
hematoma 3 days ago.

EXAM:
CT PELVIS WITH CONTRAST
TECHNIQUE: Multidetector CT imaging of the pelvis was performed using the
standard protocol following the bolus administration of intravenous
contrast.
CONTRAST:  100mL OMNIPAQUE IOHEXOL 300 MG/ML  SOLN

[Series 5: pelvis thin · axial · 0.81mm/px · z∈[+557,+838]mm · 13 of 511 slices shown, 15 images]
[im 22/511  soft-tissue]
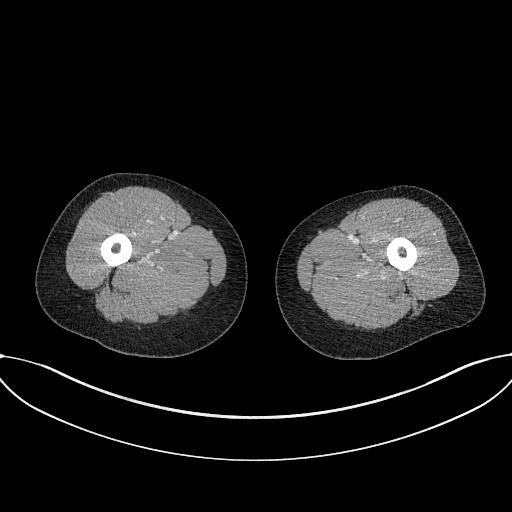
[im 22/511  bone]
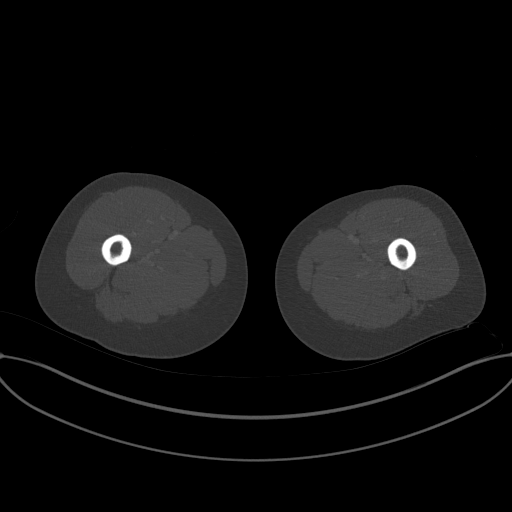
[im 64/511  soft-tissue]
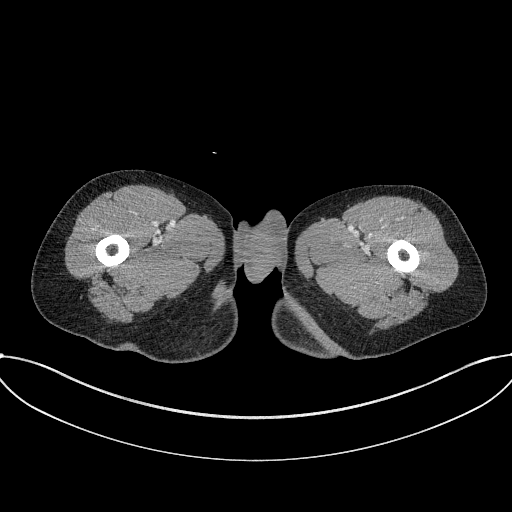
[im 107/511  soft-tissue]
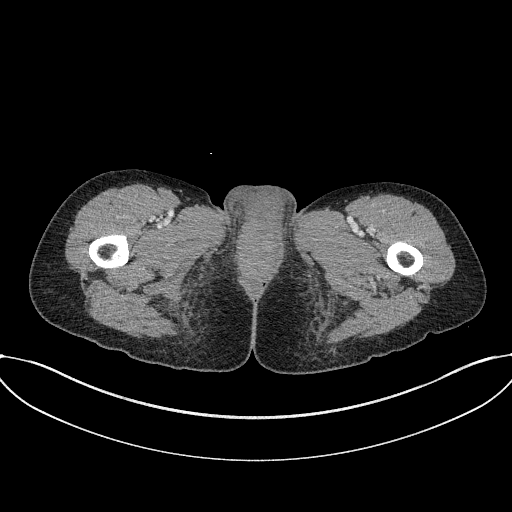
[im 149/511  soft-tissue]
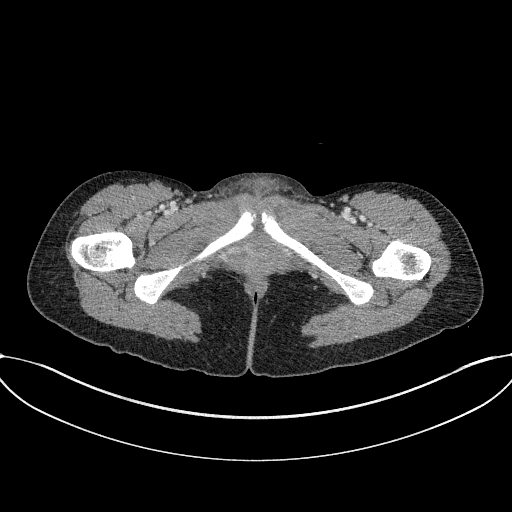
[im 171/511  soft-tissue]
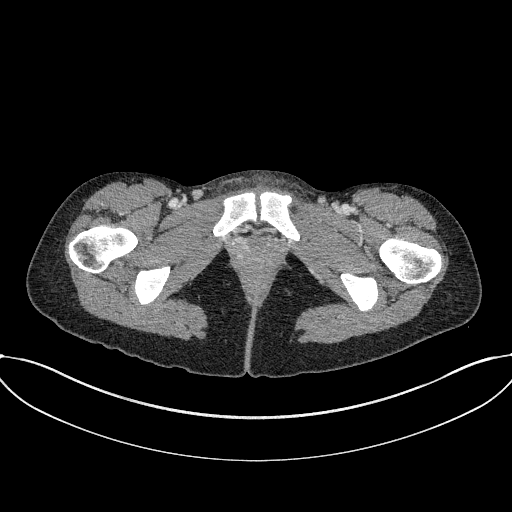
[im 213/511  soft-tissue]
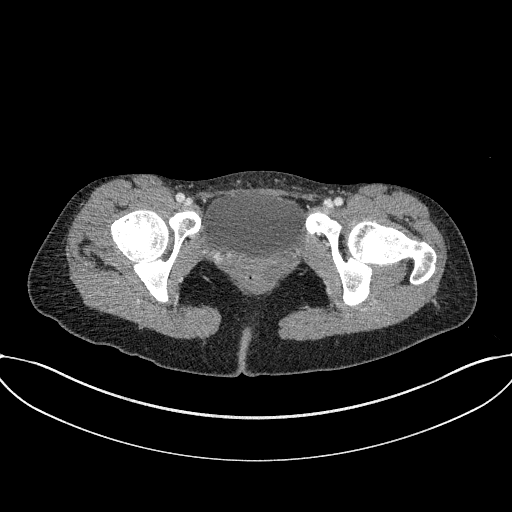
[im 256/511  soft-tissue]
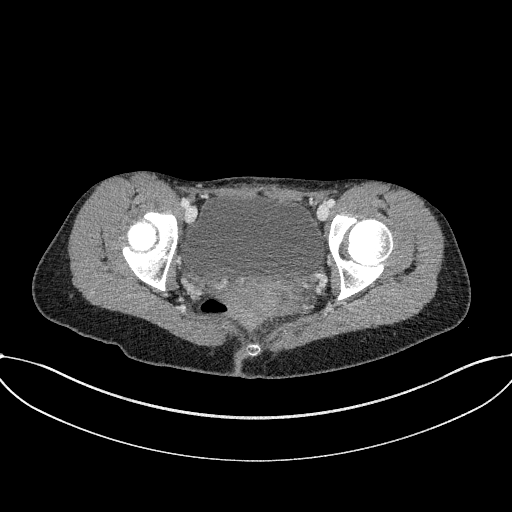
[im 298/511  soft-tissue]
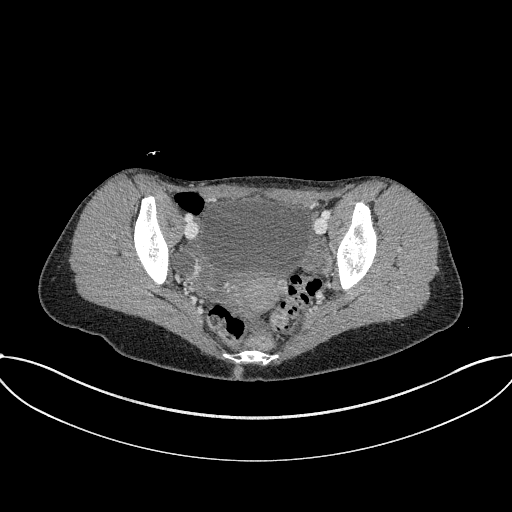
[im 341/511  soft-tissue]
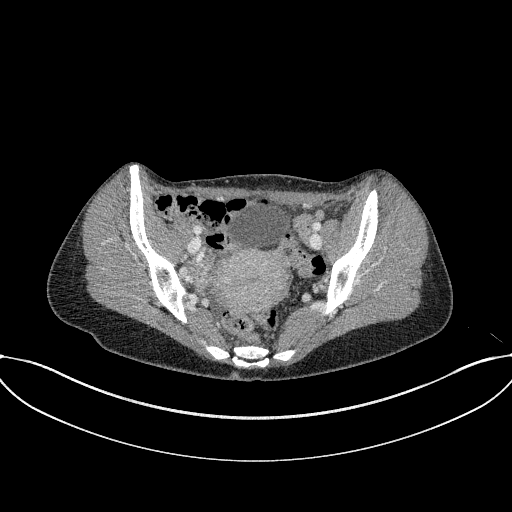
[im 341/511  bone]
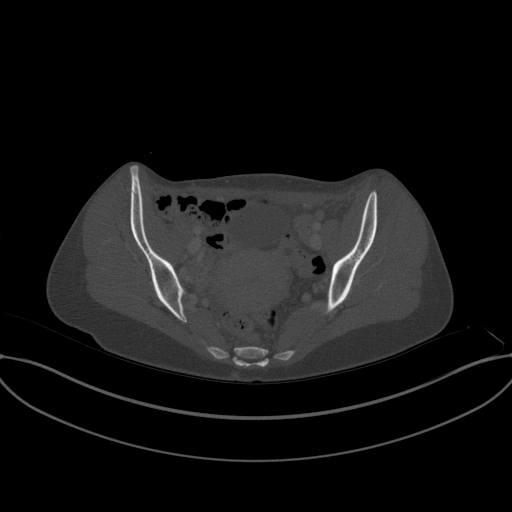
[im 362/511  soft-tissue]
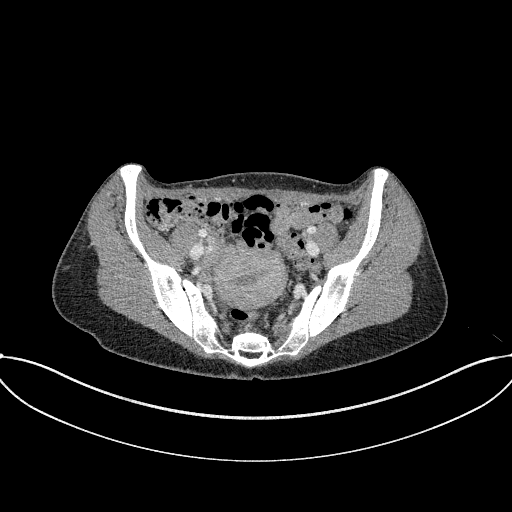
[im 404/511  soft-tissue]
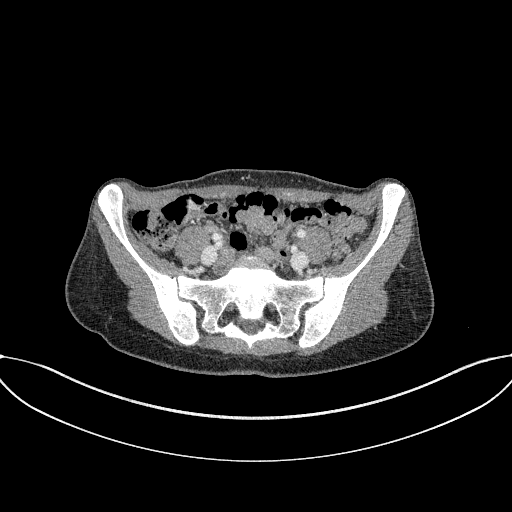
[im 447/511  soft-tissue]
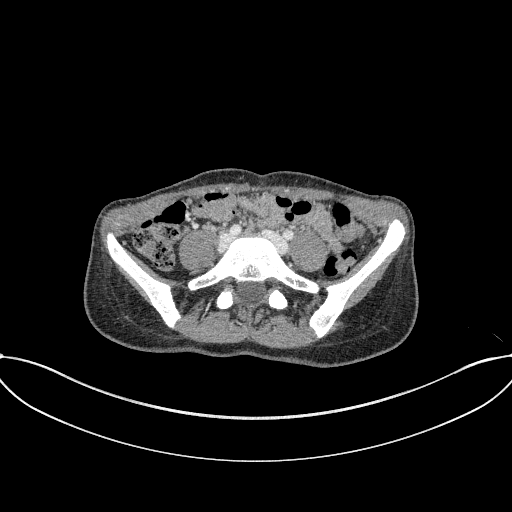
[im 489/511  soft-tissue]
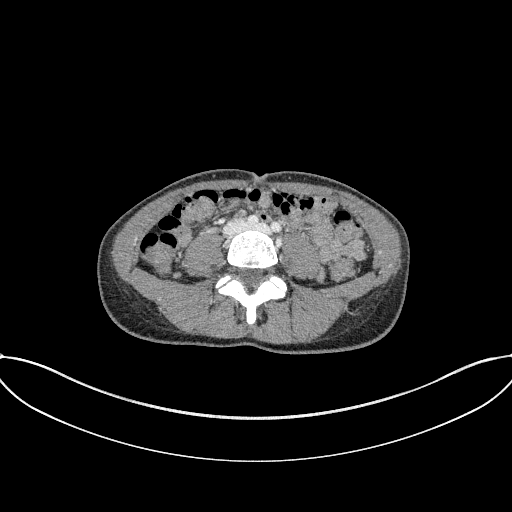

[Series 9: coronal st · coronal · 0.60mm/px · 3 of 99 slices shown]
[im 33/99  soft-tissue]
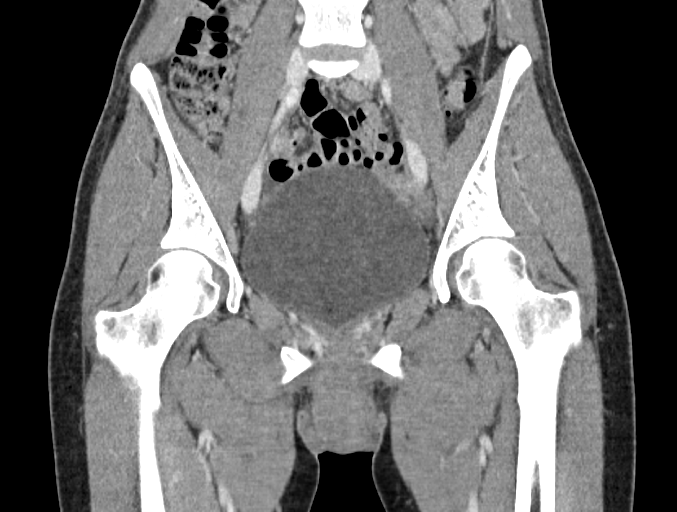
[im 44/99  soft-tissue]
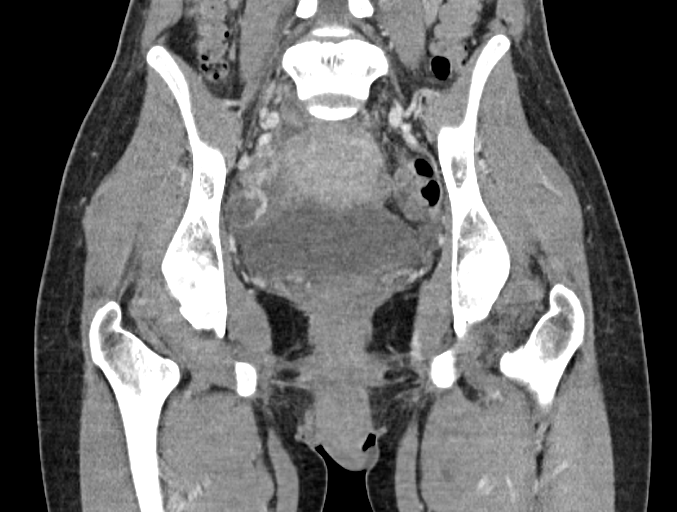
[im 55/99  soft-tissue]
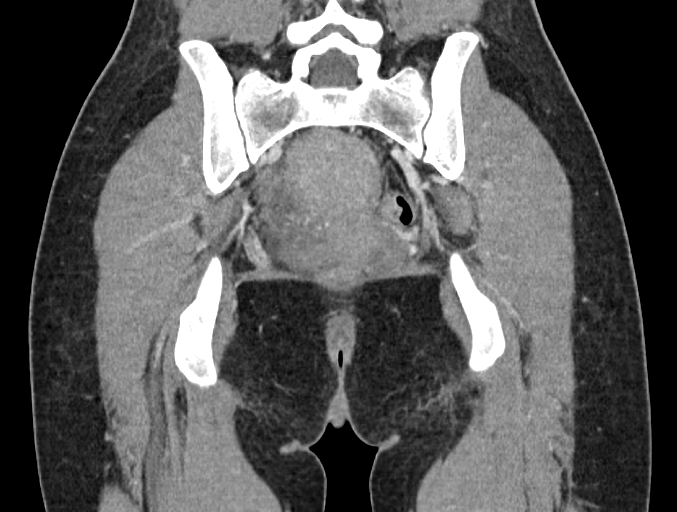

[16 of 46 positions shown; findings below may reference images not displayed]

FINDINGS: Urinary Tract:  No abnormality visualized.

Bowel:  Unremarkable visualized pelvic bowel loops.

Vascular/Lymphatic: No pathologically enlarged lymph nodes. No
significant vascular abnormality seen.

Reproductive: The uterus appears normal. Follicles on both ovaries.
Tiny amount of free fluid in the pelvis, typical for a female of
this age.

There is a 5.9 x 4.4 x 3.3 cm high density soft tissue lesion in the
labia which may represent a soft tissue hematoma or a mass. Does the
patient have bruising in that area? There is edema in the labia. No
vaginal abnormality is noted.

Other:  None.

Musculoskeletal: No suspicious bone lesions identified.
IMPRESSION: 1. 5.9 x 4.4 x 3.3 cm high density soft tissue lesion in the labia
which may represent a soft tissue hematoma or a mass.
2. Edema in the labia adjacent to this abnormality.
